# Patient Record
Sex: Female | Born: 1985
Health system: Southern US, Community
[De-identification: ages and names within clinical notes are randomized; demographics above are authoritative.]

## PROBLEM LIST (undated history)

## (undated) DIAGNOSIS — Z3490 Encounter for supervision of normal pregnancy, unspecified, unspecified trimester: Secondary | ICD-10-CM

## (undated) DIAGNOSIS — E559 Vitamin D deficiency, unspecified: Secondary | ICD-10-CM

## (undated) HISTORY — PX: TONSILLECTOMY: SUR1361

---

## 2012-07-16 ENCOUNTER — Encounter (HOSPITAL_BASED_OUTPATIENT_CLINIC_OR_DEPARTMENT_OTHER): Payer: Self-pay | Admitting: Emergency Medicine

## 2012-07-16 ENCOUNTER — Emergency Department (HOSPITAL_BASED_OUTPATIENT_CLINIC_OR_DEPARTMENT_OTHER)
Admission: EM | Admit: 2012-07-16 | Discharge: 2012-07-17 | Disposition: A | Discharge status: Home / Self Care | Payer: BC Managed Care – PPO | Attending: Emergency Medicine | Admitting: Emergency Medicine

## 2012-07-16 DIAGNOSIS — O200 Threatened abortion: Secondary | ICD-10-CM | POA: Insufficient documentation

## 2012-07-16 DIAGNOSIS — O99330 Smoking (tobacco) complicating pregnancy, unspecified trimester: Secondary | ICD-10-CM | POA: Insufficient documentation

## 2012-07-16 DIAGNOSIS — Z809 Family history of malignant neoplasm, unspecified: Secondary | ICD-10-CM | POA: Insufficient documentation

## 2012-07-16 DIAGNOSIS — Z833 Family history of diabetes mellitus: Secondary | ICD-10-CM | POA: Insufficient documentation

## 2012-07-16 NOTE — ED Notes (Signed)
approx 10 nweeks pregnant   Vag bleeding onet around 8p

## 2012-07-17 LAB — WET PREP, GENITAL
Trich, Wet Prep: NONE SEEN
Yeast Wet Prep HPF POC: NONE SEEN

## 2012-07-17 LAB — HCG, QUANTITATIVE, PREGNANCY: hCG, Beta Chain, Quant, S: 84081 m[IU]/mL — ABNORMAL HIGH (ref <5)

## 2012-07-17 NOTE — ED Notes (Signed)
Pt. Bedside US done by Dr. Read Drivers and Dr. Fonnie Jarvis

## 2012-07-17 NOTE — ED Provider Notes (Addendum)
History     CSN: [id number]  Arrival date & time 07/16/12  2317   First MD Initiated Contact with Patient 07/17/12 0050      Chief Complaint  Patient presents with  . Vaginal Bleeding    (Consider location/radiation/quality/duration/timing/severity/associated sxs/prior treatment) HPI This is a 26 year old female with twin pregnancy at about [redacted] weeks gestation. She developed vaginal bleeding yesterday evening about 8 PM. The bleeding is lighter than a period. She has not passed any tissue. She is having some mild suprapubic cramping. There are no specific mitigating or exacerbating factors. She does have prenatal care.  History reviewed. No pertinent past medical history.  Past Surgical History  Procedure Date  . Tonsillectomy     Family History  Problem Relation Age of Onset  . Cancer Other   . Diabetes Other     History  Substance Use Topics  . Smoking status: Current Every Day Smoker  . Smokeless tobacco: Not on file  . Alcohol Use: No    OB History    Grav Para Term Preterm Abortions TAB SAB Ect Mult Living   2 1              Review of Systems  All other systems reviewed and are negative.    Allergies  Review of patient's allergies indicates no known allergies.  Home Medications   Current Outpatient Rx  Name Route Sig Dispense Refill  . PRENATAL 27-0.8 MG PO TABS Oral Take 1 tablet by mouth daily.      BP 130/71  Pulse 74  Temp 98.2 F (36.8 C) (Oral)  Resp 20  Ht 5\' 8"  (1.727 m)  Wt 178 lb (80.74 kg)  BMI 27.06 kg/m2  SpO2 100%  Physical Exam General: Well-developed, well-nourished female in no acute distress; appearance consistent with age of record HENT: normocephalic, atraumatic Eyes: pupils equal round and reactive to light; extraocular muscles intact Neck: supple Heart: regular rate and rhythm Lungs: clear to auscultation bilaterally Abdomen: soft; nondistended; mild suprapubic tenderness; no masses or hepatosplenomegaly; bowel  sounds present Extremities: No deformity; full range of motion; pulses normal; no edema GU: Normal external genitalia; physiologic appearing vaginal discharge; cervical spotting; mild cervical motion tenderness; mild left adnexal tenderness Neurologic: Awake, alert and oriented; motor function intact in all extremities and symmetric; no facial droop Skin: Warm and dry Psychiatric: Normal mood and affect  Twin viable intrauterine pregnancy seen on bedside ultrasound  ED Course  Procedures (including critical care time)     MDM   Nursing notes and vitals signs, including pulse oximetry, reviewed.  Summary of this visit's results, reviewed by myself:  Labs:  Results for orders placed during the hospital encounter of 07/16/12  WET PREP, GENITAL      Component Value Range   Yeast Wet Prep HPF POC NONE SEEN  NONE SEEN   Trich, Wet Prep NONE SEEN  NONE SEEN   Clue Cells Wet Prep HPF POC MODERATE (*) NONE SEEN   WBC, Wet Prep HPF POC FEW (*) NONE SEEN    1:29 AM Patient was advised of ultrasound and lab findings. She has an appointment with her OB/GYN in 3 days.        Hanley Seamen, MD 07/17/12 0128  Hanley Seamen, MD 07/17/12 4098

## 2012-07-17 NOTE — ED Notes (Signed)
Pt. In no distress.

## 2012-10-19 ENCOUNTER — Encounter (HOSPITAL_BASED_OUTPATIENT_CLINIC_OR_DEPARTMENT_OTHER): Payer: Self-pay | Admitting: *Deleted

## 2012-10-19 ENCOUNTER — Emergency Department (HOSPITAL_BASED_OUTPATIENT_CLINIC_OR_DEPARTMENT_OTHER): Payer: BC Managed Care – PPO

## 2012-10-19 ENCOUNTER — Emergency Department (HOSPITAL_BASED_OUTPATIENT_CLINIC_OR_DEPARTMENT_OTHER)
Admission: EM | Admit: 2012-10-19 | Discharge: 2012-10-19 | Disposition: A | Discharge status: Home / Self Care | Payer: BC Managed Care – PPO | Attending: Emergency Medicine | Admitting: Emergency Medicine

## 2012-10-19 DIAGNOSIS — O9989 Other specified diseases and conditions complicating pregnancy, childbirth and the puerperium: Secondary | ICD-10-CM | POA: Insufficient documentation

## 2012-10-19 DIAGNOSIS — O26899 Other specified pregnancy related conditions, unspecified trimester: Secondary | ICD-10-CM

## 2012-10-19 DIAGNOSIS — O99334 Smoking (tobacco) complicating childbirth: Secondary | ICD-10-CM | POA: Insufficient documentation

## 2012-10-19 DIAGNOSIS — R109 Unspecified abdominal pain: Secondary | ICD-10-CM

## 2012-10-19 HISTORY — DX: Encounter for supervision of normal pregnancy, unspecified, unspecified trimester: Z34.90

## 2012-10-19 LAB — COMPREHENSIVE METABOLIC PANEL
ALT: 7 U/L (ref 0–35)
Alkaline Phosphatase: 58 U/L (ref 39–117)
GFR calc Af Amer: >90 mL/min (ref >90)
Glucose, Bld: 81 mg/dL (ref 70–99)
Potassium: 3.6 mEq/L (ref 3.5–5.1)
Sodium: 135 mEq/L (ref 135–145)
Total Protein: 6.7 g/dL (ref 6.0–8.3)

## 2012-10-19 LAB — URINALYSIS, ROUTINE W REFLEX MICROSCOPIC
Bilirubin Urine: NEGATIVE
Glucose, UA: NEGATIVE mg/dL
Hgb urine dipstick: NEGATIVE
Specific Gravity, Urine: 1.02 (ref 1.005–1.030)
pH: 8 (ref 5.0–8.0)

## 2012-10-19 LAB — CBC WITH DIFFERENTIAL/PLATELET
Eosinophils Absolute: 0.1 10*3/uL (ref 0.0–0.7)
Lymphocytes Relative: 23 % (ref 12–46)
Lymphs Abs: 1.9 10*3/uL (ref 0.7–4.0)
MCH: 27.6 pg (ref 26.0–34.0)
Neutro Abs: 5.4 10*3/uL (ref 1.7–7.7)
Neutrophils Relative %: 66 % (ref 43–77)
Platelets: 191 10*3/uL (ref 150–400)
RBC: 3.66 MIL/uL — ABNORMAL LOW (ref 3.87–5.11)
WBC: 8.2 10*3/uL (ref 4.0–10.5)

## 2012-10-19 LAB — WET PREP, GENITAL: Trich, Wet Prep: NONE SEEN

## 2012-10-19 MED ORDER — SODIUM CHLORIDE 0.9 % IV BOLUS (SEPSIS): 500.0000 mL | Freq: Once | INTRAVENOUS | Status: DC

## 2012-10-19 NOTE — ED Notes (Signed)
Abdominal pain x 3 hours. [redacted] weeks pregnant. Denies vaginal discharge. Prenatal care in [place] by Dr Shawnie Pons

## 2012-10-19 NOTE — ED Notes (Signed)
Pt is [redacted] weeks pregnant with twins.

## 2012-10-19 NOTE — ED Provider Notes (Signed)
History     CSN: [id number]  Arrival date & time 10/19/12  1749   First MD Initiated Contact with Patient 10/19/12 1801      Chief Complaint  Patient presents with  . Abdominal Pain    (Consider location/radiation/quality/duration/timing/severity/associated sxs/prior treatment) HPI Comments: Pt states that she is 24 and 6 pregnant with her second pregnancy and she is pregnant with twins:pt denies bleeding:pt denies history of any problems with previous pregnancy:pt c/o left upper and right lower abdominal pain  Patient is a 26 y.o. female presenting with abdominal pain. The history is provided by the patient. No language interpreter was used.  Abdominal Pain The primary symptoms of the illness include abdominal pain. The primary symptoms of the illness do not include fever, nausea, vomiting, diarrhea, dysuria, vaginal discharge or vaginal bleeding. The current episode started 3 to 5 hours ago. The onset of the illness was sudden. The problem has not changed since onset. The patient states that she believes she is currently pregnant. Symptoms associated with the illness do not include urgency, hematuria, frequency or back pain.    Past Medical History  Diagnosis Date  . Pregnant     Past Surgical History  Procedure Date  . Tonsillectomy     Family History  Problem Relation Age of Onset  . Cancer Other   . Diabetes Other     History  Substance Use Topics  . Smoking status: Current Every Day Smoker -- 0.5 packs/day    Types: Cigarettes  . Smokeless tobacco: Not on file  . Alcohol Use: No    OB History    Grav Para Term Preterm Abortions TAB SAB Ect Mult Living   2 1              Review of Systems  Constitutional: Negative for fever.  Respiratory: Negative.   Cardiovascular: Negative.   Gastrointestinal: Positive for abdominal pain. Negative for nausea, vomiting and diarrhea.  Genitourinary: Negative for dysuria, urgency, frequency, hematuria, vaginal bleeding  and vaginal discharge.  Musculoskeletal: Negative for back pain.    Allergies  Review of patient's allergies indicates no known allergies.  Home Medications   Current Outpatient Rx  Name  Route  Sig  Dispense  Refill  . PRENATAL 27-0.8 MG PO TABS   Oral   Take 1 tablet by mouth daily.           BP 110/61  Pulse 76  Temp 98.5 F (36.9 C) (Oral)  Resp 20  SpO2 99%  Physical Exam  Nursing note and vitals reviewed. Constitutional: She is oriented to person, place, and time. She appears well-developed and well-nourished.  HENT:  Head: Normocephalic and atraumatic.  Eyes: Conjunctivae normal and EOM are normal.  Neck: Neck supple.  Cardiovascular: Normal rate and regular rhythm.   Pulmonary/Chest: Effort normal and breath sounds normal.  Abdominal:       Gravid:generalized tenderness  Genitourinary:       White vaginal discharge:non dilated  Musculoskeletal: Normal range of motion.  Neurological: She is alert and oriented to person, place, and time.  Skin: Skin is warm and dry.  Psychiatric: She has a normal mood and affect.    ED Course  Procedures (including critical care time)  Labs Reviewed  URINALYSIS, ROUTINE W REFLEX MICROSCOPIC - Abnormal; Notable for the following:    APPearance CLOUDY (*)     Ketones, ur 15 (*)     All other components within normal limits  WET PREP, GENITAL - Abnormal;  Notable for the following:    Clue Cells Wet Prep HPF POC MANY (*)     WBC, Wet Prep HPF POC MODERATE (*)     All other components within normal limits  CBC WITH DIFFERENTIAL - Abnormal; Notable for the following:    RBC 3.66 (*)     Hemoglobin 10.1 (*)     HCT 30.3 (*)     All other components within normal limits  COMPREHENSIVE METABOLIC PANEL - Abnormal; Notable for the following:    Albumin 2.9 (*)     All other components within normal limits   No results found.   1. Abdominal pain in pregnancy       MDM  Pt symptoms have resolved:no sign of  contracting noted:discussed with Dr. Conard Novak of [place] [facility] who stated that she couldn't make a recommendation but that pt could call the office to follow WU:[id number] shows nothing acute:pt doesn't have a uti and pt has not had any fluid leaking or bleeding       Teressa Lower, NP 10/19/12 2206

## 2012-10-19 NOTE — ED Notes (Signed)
Olegario Messier at [facility] called to check on pt

## 2012-10-19 NOTE — ED Notes (Signed)
Jamie Knee RN from [facility] called to notify me that they only have one set of monitors. They are currently looking for another one for baby B.  I advised RN to get fetal doppler heart tone on baby B while looking for another monitor and call me with results.

## 2012-10-19 NOTE — ED Notes (Addendum)
Westley Hummer RN at [facility] called with results of fht for baby B Stated they are 152 Patient to go for ultrasound. Avised to get fht on baby B with doppler Q 15 min.

## 2012-10-19 NOTE — ED Notes (Signed)
Notified by [facility] ED charge nurse that they have a patient being triaged pregnant with twins presenting with c/o abdominal pain. Patient states she is a patient of Dr. Desmond Dike in [place] and is 24+[redacted] weeks along

## 2012-10-19 NOTE — ED Notes (Signed)
Patient transported to Ultrasound 

## 2012-10-20 NOTE — ED Provider Notes (Signed)
Medical screening examination/treatment/procedure(s) were performed by non-physician practitioner and as supervising physician I was immediately available for consultation/collaboration.  Jones Skene, M.D.      Jones Skene, MD 10/20/12 1610

## 2014-07-11 IMAGING — US US OB LIMITED
1 series · 14 of 28 positions shown · non-contrast
Comparison: none

CLINICAL DATA: Abdominal pain, twin pregnancy

[Series 1: us ob limited · 0.30mm/px · 14 of 45 slices shown]
[im 2/45]
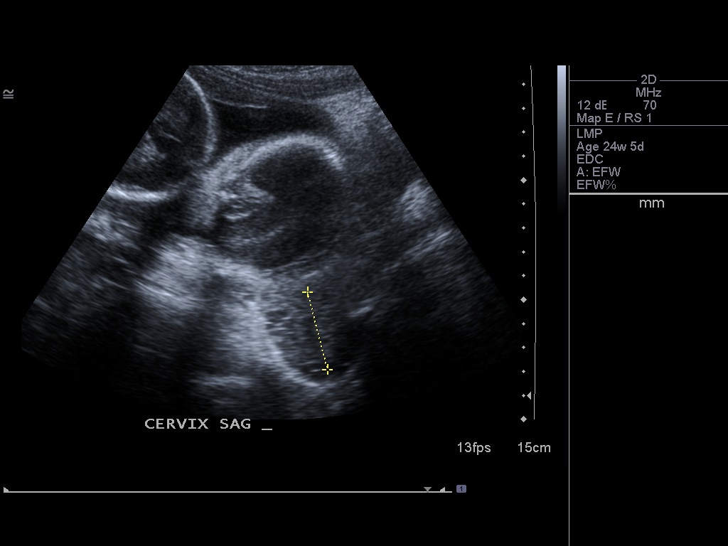
[im 5/45]
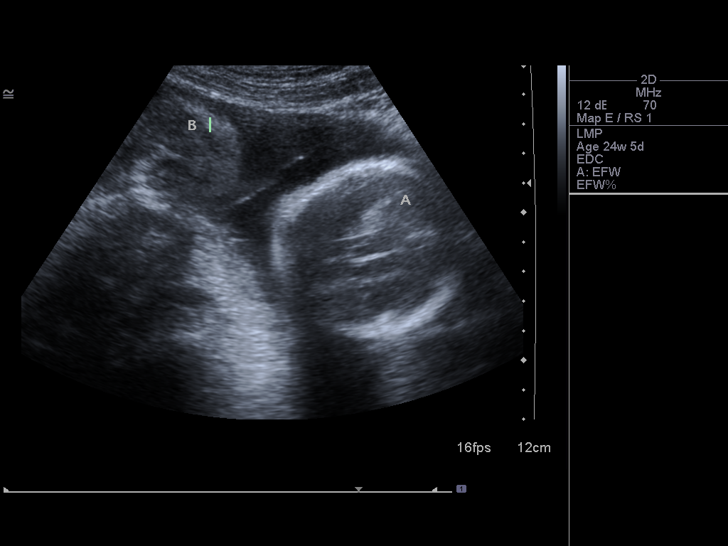
[im 9/45]
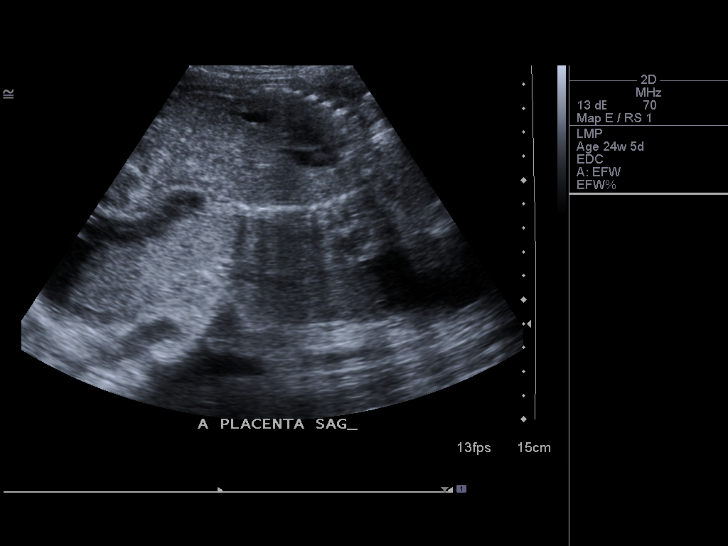
[im 12/45]
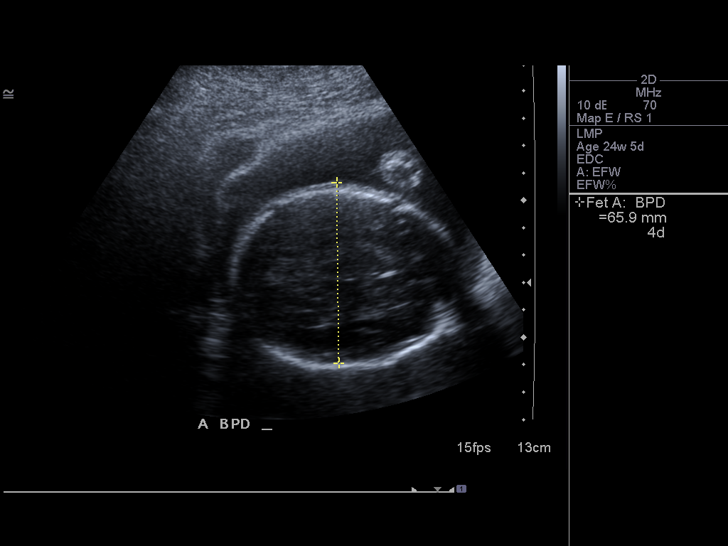
[im 15/45]
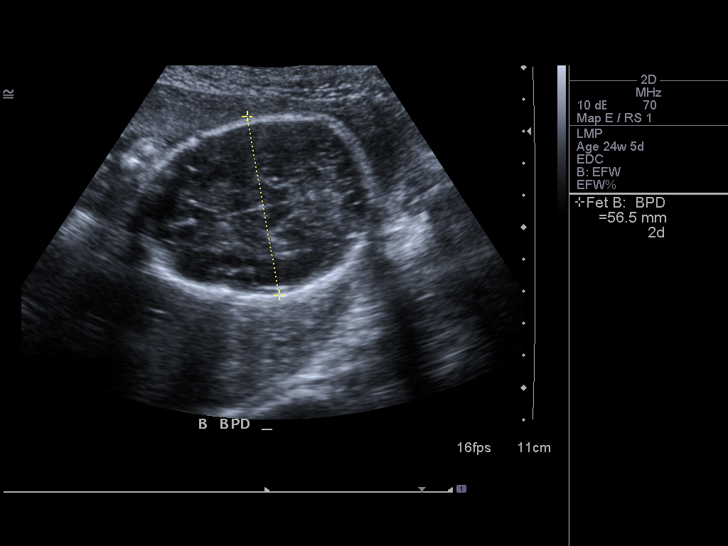
[im 18/45]
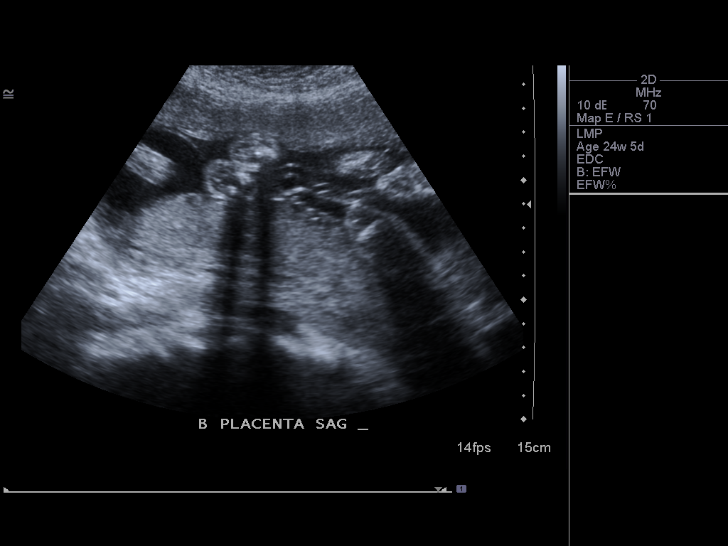
[im 22/45]
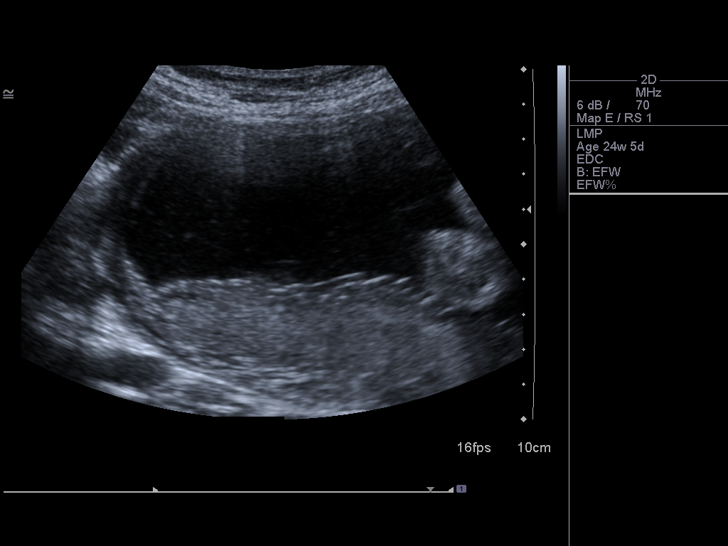
[im 25/45]
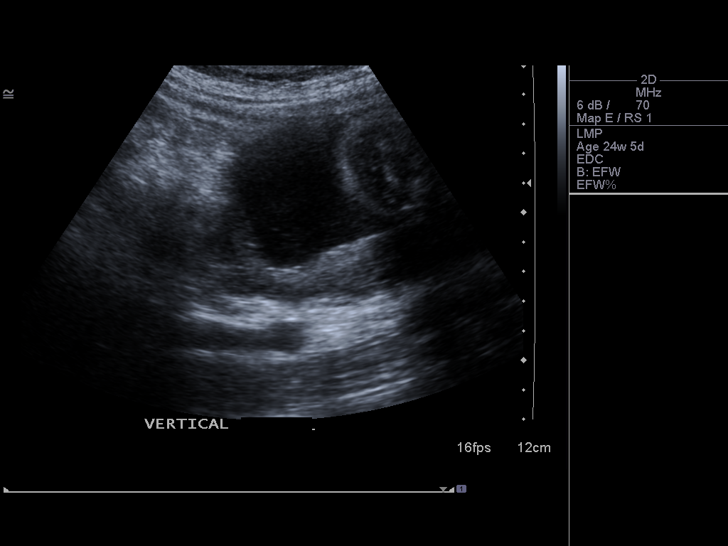
[im 28/45]
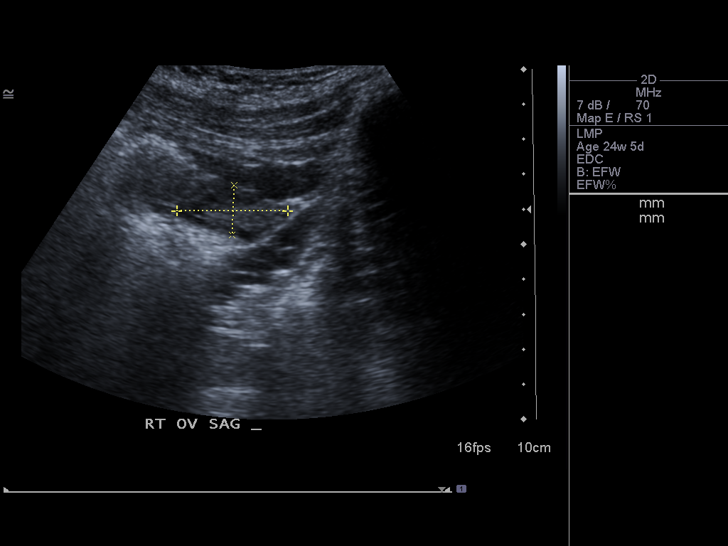
[im 31/45]
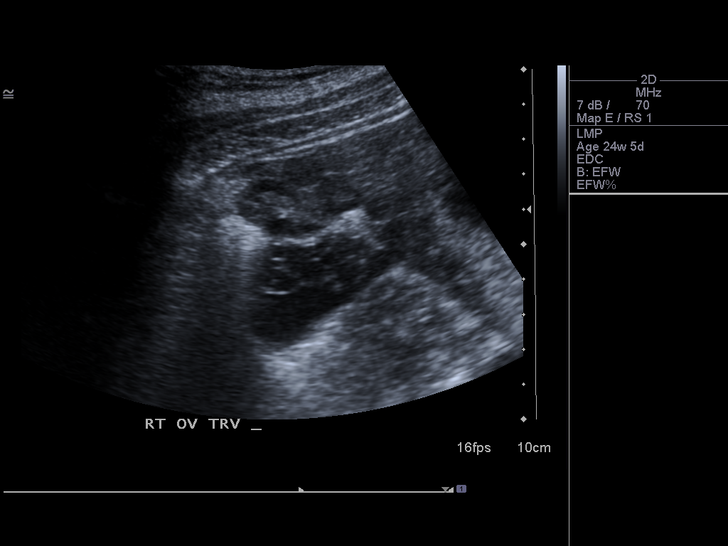
[im 35/45]
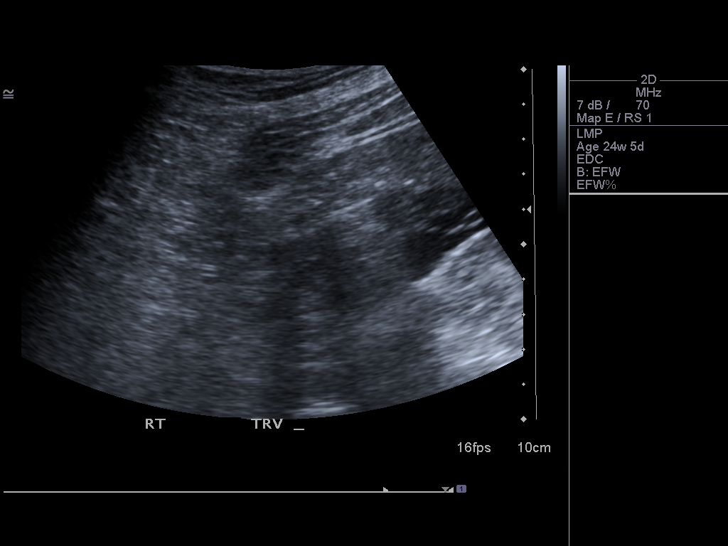
[im 38/45]
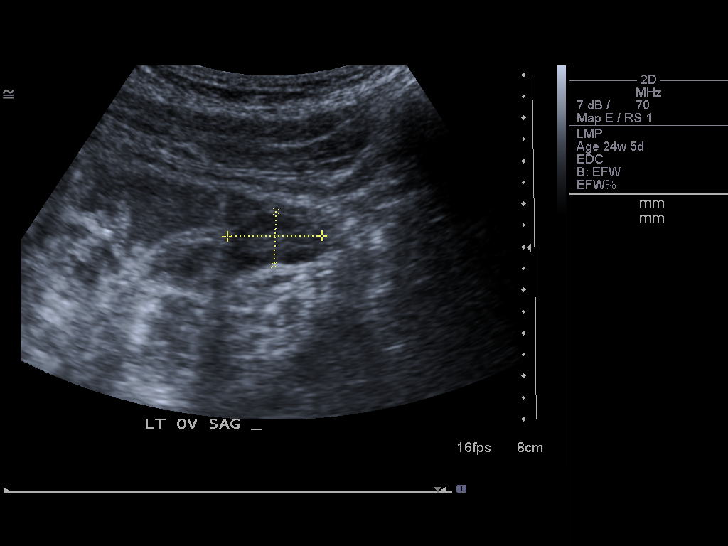
[im 41/45]
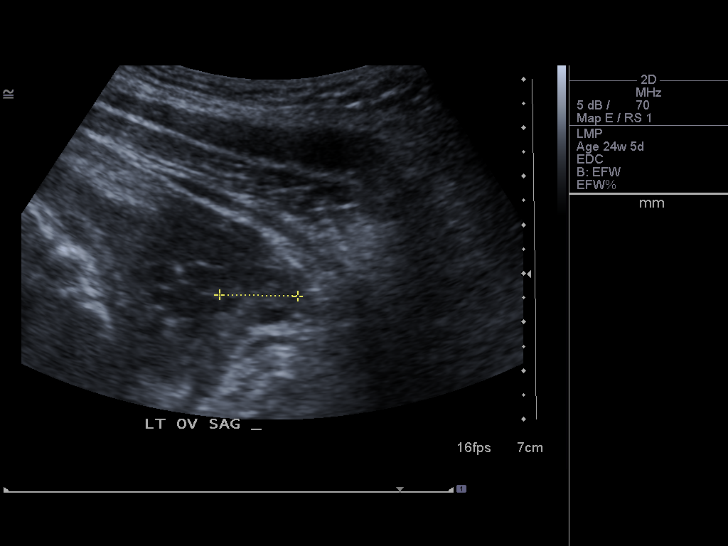
[im 45/45]
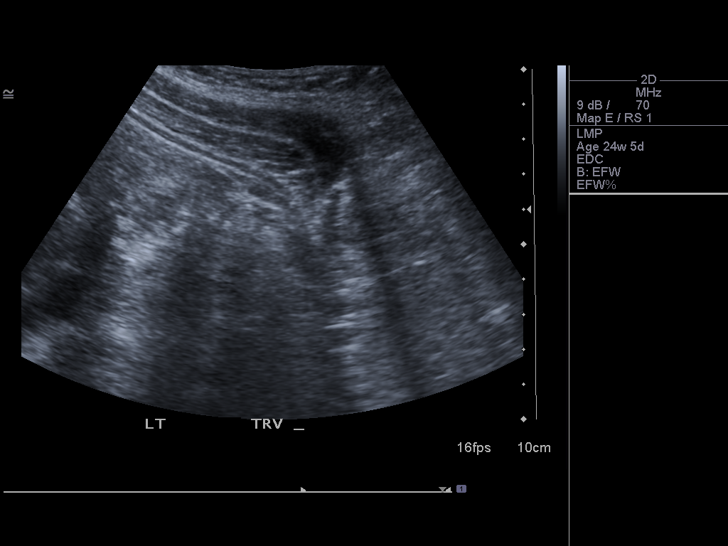

[14 of 28 positions shown; findings below may reference images not displayed]

LIMITED TWIN OBSTETRIC 14+ WK ULTRASOUND

TWIN A
Heart Rate: 140 bpm
Movement:  Visualized
Presentation: Cephalic
Placental Location: Posterior
Previa: No
Amniotic Fluid (Subjective): Normal
Amniotic Fluid (Objective): Vertical Pocket: 5.0cm

FETAL BIOMETRY (TWIN A)
BPD:  6.6cm 26w         4d

TWIN B
Heart Rate:  787bpm
Movement:  Visualized
Presentation:  Transverse, head maternal left
Placental Location: Fundal
Previa: No
Amniotic Fluid (Subjective): Normal
Amniotic Fluid (Objective): Vertical Pocket: 5.0cm

FETAL BIOMETRY (TWIN B)
BPD:  [5.7]cm     [23]w       [5]d

Assigned GA:            [24]w [5]d  Assigned EDC: [02/02/13]

MATERNAL FINDINGS:
Cervix:  Long and closed by transabdominal technique
IMPRESSION: Living intrauterine twin gestation without acute abnormality on
this limited exam.  Apparent discrepancy in fetal biometry is
noted, however examination today is limited to biparietal diameter
measurement and comprehensive fetal survey and biometry evaluation
is recommended if not already recently performed.

## 2014-09-01 ENCOUNTER — Encounter (HOSPITAL_BASED_OUTPATIENT_CLINIC_OR_DEPARTMENT_OTHER): Payer: Self-pay | Admitting: *Deleted

## 2017-09-07 ENCOUNTER — Emergency Department (HOSPITAL_BASED_OUTPATIENT_CLINIC_OR_DEPARTMENT_OTHER)
Admission: EM | Admit: 2017-09-07 | Discharge: 2017-09-07 | Disposition: A | Discharge status: Home / Self Care | Payer: Worker's Compensation | Attending: Emergency Medicine | Admitting: Emergency Medicine

## 2017-09-07 ENCOUNTER — Encounter (HOSPITAL_BASED_OUTPATIENT_CLINIC_OR_DEPARTMENT_OTHER): Payer: Self-pay

## 2017-09-07 DIAGNOSIS — F17210 Nicotine dependence, cigarettes, uncomplicated: Secondary | ICD-10-CM | POA: Insufficient documentation

## 2017-09-07 DIAGNOSIS — S39012D Strain of muscle, fascia and tendon of lower back, subsequent encounter: Secondary | ICD-10-CM | POA: Insufficient documentation

## 2017-09-07 DIAGNOSIS — X500XXD Overexertion from strenuous movement or load, subsequent encounter: Secondary | ICD-10-CM | POA: Insufficient documentation

## 2017-09-07 DIAGNOSIS — M545 Low back pain: Secondary | ICD-10-CM

## 2017-09-07 HISTORY — DX: Vitamin D deficiency, unspecified: E55.9

## 2017-09-07 LAB — PREGNANCY, URINE: Preg Test, Ur: NEGATIVE

## 2017-09-07 MED ORDER — CYCLOBENZAPRINE HCL 10 MG PO TABS: 10.0000 mg | ORAL_TABLET | Freq: Two times a day (BID) | ORAL | 0 refills | Status: DC | PRN

## 2017-09-07 MED FILL — CYCLOBENZAPRINE 10 MG TAB: 10 | 10 days supply | Qty: 20 | Fill #0

## 2017-09-07 NOTE — Discharge Instructions (Addendum)
You can take Tylenol or Ibuprofen as directed for pain. You can alternate Tylenol and Ibuprofen every 4 hours. If you take Tylenol at 1pm, then you can take Ibuprofen at 5pm. Then you can take Tylenol again at 9pm.   Take Flexeril as prescribed. This medication will make you drowsy so do not drive or drink alcohol when taking it.  Follow-up with your primary care doctor in the next 2-4 days.  If you do not have a primary care doctor, you can use the clinic provided paperwork.  Return to the Emergency Department immediately for any worsening back pain, neck pain, difficulty walking, numbness/weaknss of your arms or legs, urinary or bowel accidents, fever or any other worsening or concerning symptoms.

## 2017-09-07 NOTE — ED Provider Notes (Signed)
[facility] EMERGENCY DEPARTMENT Provider Note   CSN: [id number] Arrival date & time: 09/07/17  1207     History   Chief Complaint Chief Complaint  Patient presents with  . Back Pain    HPI Jamie Hamilton is a 31 y.o. female who presents with lower back pain that started approximately 4 days ago.  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  She denies any other preceding trauma, injury, fall.  Patient reports that she has been taking Tylenol once in the morning once at night for symptom relief.  Patient reports that she is also applied heat with minimal improvement.  She reports that she still is able to ambulate and bear weight but reports worsening pain with ambulation.  Patient was [facility] 11/[facility] for the same symptoms.  X-rays at that time were normal.  Patient was instructed to take NSAIDs and use heat therapy. Denies fevers, weight loss, numbness/weakness of upper and lower extremities, bowel/bladder incontinence, saddle anesthesia, history of back surgery, history of IVDA.   The history is provided by the patient.    Past Medical History:  Diagnosis Date  . Pregnant   . Vitamin D deficiency     There are no active problems to display for this patient.   Past Surgical History:  Procedure Laterality Date  . TONSILLECTOMY      OB History    Gravida Para Term Preterm AB Living   2 1           SAB TAB Ectopic Multiple Live Births                   Home Medications    Prior to Admission medications   Medication Sig Start Date End Date Taking? Authorizing Provider  Cholecalciferol (VITAMIN D PO) Take by mouth.   Yes [provider]  cyclobenzaprine (FLEXERIL) 10 MG tablet Take 1 tablet (10 mg total) 2 (two) times daily as needed by mouth for muscle spasms. 09/07/17   Maxwell CaulLayden, Lindsey A, PA-C    Family History Family History  Problem Relation Age of Onset  . Cancer Other   . Diabetes Other      Social History Social History   Tobacco Use  . Smoking status: Current Every Day Smoker    Packs/day: 0.50    Types: Cigarettes  . Smokeless tobacco: Never Used  Substance Use Topics  . Alcohol use: Yes    Comment: occ  . Drug use: No     Allergies   Patient has no known allergies.   Review of Systems Review of Systems  Musculoskeletal: Positive for back pain. Negative for gait problem and neck pain.  Neurological: Negative for weakness and numbness.     Physical Exam Updated Vital Signs BP (!) 112/59   Pulse 67   Temp 98.6 F (37 C) (Oral)   Resp 20   Ht 5\' 8"  (1.727 m)   Wt 101.2 kg (223 lb)   SpO2 100%   Breastfeeding? Unknown   BMI 33.91 kg/m   Physical Exam  Constitutional: She appears well-developed and well-nourished.  HENT:  Head: Normocephalic and atraumatic.  Eyes: Conjunctivae and EOM are normal. Right eye exhibits no discharge. Left eye exhibits no discharge. No scleral icterus.  Neck:  Full flexion/extension and lateral movement of neck fully intact. No bony midline tenderness. No deformities or crepitus.   Pulmonary/Chest: Effort normal.  Musculoskeletal:  Arms: Diffuse muscular tenderness overlying the entire lumbar region. No deformity or crepitus noted.  Limited flexion/extension of back secondary to patient's pain.  Neurological: She is alert.  Follows commands, Moves all extremities  5/5 strength to BUE and BLE  Sensation intact throughout all major nerve distributions Normal gait  Skin: Skin is warm and dry.  Psychiatric: She has a normal mood and affect. Her speech is normal and behavior is normal.  Nursing note and vitals reviewed.    ED Treatments / Results  Labs (all labs ordered are listed, but only abnormal results are displayed) Labs Reviewed  PREGNANCY, URINE    EKG  EKG Interpretation None       Radiology No results found.  Procedures Procedures (including critical care time)  Medications Ordered  in ED Medications - No data to display   Initial Impression / Assessment and Plan / ED Course  I have reviewed the triage vital signs and the nursing notes.  Pertinent labs & imaging results that were available during my care of the patient were reviewed by me and considered in my medical decision making (see chart for details).     31 year old female who presents with lower back pain times 4 days.  History of picking up heavy tables while at work.  Seen on [place] ED on 09/04/17 for the same symptoms.  Conservative therapies discussed.  Patient comes to the emergency department today because of persistent pain.  No difficulties ambulating. Patient is afebrile, non-toxic appearing, sitting comfortably on examination table. Vital signs reviewed and stable.  No red flag symptoms.  No neurological deficits on exam.  Consider musculoskeletal strain.  History/physical exam not concerning for spinal abscess or cauda equina.  Records reviewed to the patient was seen at the emergency depart time and had XRs that showed no abnormalities.  Given that patient denies any new trauma, injury, fall.  No indication for further imaging at this time.  Patient's note states that provider sent patient home with NSAIDs and muscle relaxers.  No evidence of muscle or extra prescription noted in chart.  Patient reports that she was never prescribed any other medication.  Patient evaluated on prescription monitoring program.  No evidence of Flexeril.  Plan to treat with NSAIDs and Flexeril. Provided patient with a list of clinic resources to use if he does not have a PCP. Instructed to call them today to arrange follow-up in the next 24-48 hours. Strict return precautions discussed. Patient expresses understanding and agreement to plan.     Final Clinical Impressions(s) / ED Diagnoses   Final diagnoses:  Strain of lumbar region, subsequent encounter  Acute bilateral low back pain, with sciatica presence unspecified     ED Discharge Orders        Ordered    cyclobenzaprine (FLEXERIL) 10 MG tablet  2 times daily PRN     09/07/17 1448       Maxwell CaulLayden, Lindsey A, PA-C 09/07/17 1513    Alvira MondaySchlossman, Erin, MD 09/08/17 1639

## 2017-09-07 NOTE — ED Notes (Signed)
Family at bedside. 

## 2017-09-07 NOTE — ED Triage Notes (Addendum)
c/o lower back pain-started 4 days ago after lifting tables at work-states is a WC injury-slow gait-NAD-when asked pt states she was seen at [facility] 11/5 for injury

## 2017-09-07 NOTE — ED Notes (Signed)
Pt in hallway asking about leaving; PA-C states she is getting her discharge paperwork together.

## 2017-09-07 NOTE — ED Notes (Signed)
Pt states she needs to leave to pick up her kids. PA-C examining another pt at this time; told pt I will ask PA-C about her disposition as soon as she steps out.

## 2019-07-13 ENCOUNTER — Emergency Department (HOSPITAL_BASED_OUTPATIENT_CLINIC_OR_DEPARTMENT_OTHER)
Admission: EM | Admit: 2019-07-13 | Discharge: 2019-07-13 | Disposition: A | Discharge status: Home / Self Care | Payer: Medicaid Other | Attending: Emergency Medicine | Admitting: Emergency Medicine

## 2019-07-13 ENCOUNTER — Emergency Department (HOSPITAL_BASED_OUTPATIENT_CLINIC_OR_DEPARTMENT_OTHER): Payer: Medicaid Other

## 2019-07-13 ENCOUNTER — Encounter (HOSPITAL_BASED_OUTPATIENT_CLINIC_OR_DEPARTMENT_OTHER): Payer: Self-pay

## 2019-07-13 ENCOUNTER — Other Ambulatory Visit: Payer: Self-pay

## 2019-07-13 DIAGNOSIS — J45909 Unspecified asthma, uncomplicated: Secondary | ICD-10-CM | POA: Insufficient documentation

## 2019-07-13 DIAGNOSIS — Z20828 Contact with and (suspected) exposure to other viral communicable diseases: Secondary | ICD-10-CM | POA: Diagnosis not present

## 2019-07-13 DIAGNOSIS — F17210 Nicotine dependence, cigarettes, uncomplicated: Secondary | ICD-10-CM | POA: Diagnosis not present

## 2019-07-13 DIAGNOSIS — J069 Acute upper respiratory infection, unspecified: Secondary | ICD-10-CM | POA: Diagnosis not present

## 2019-07-13 DIAGNOSIS — R0781 Pleurodynia: Secondary | ICD-10-CM

## 2019-07-13 DIAGNOSIS — R05 Cough: Secondary | ICD-10-CM | POA: Diagnosis present

## 2019-07-13 LAB — BASIC METABOLIC PANEL
Anion gap: 8 (ref 5–15)
BUN: 7 mg/dL (ref 6–20)
CO2: 25 mmol/L (ref 22–32)
Calcium: 9 mg/dL (ref 8.9–10.3)
Chloride: 104 mmol/L (ref 98–111)
Creatinine, Ser: 0.62 mg/dL (ref 0.44–1.00)
GFR calc Af Amer: >60 mL/min (ref >60)
GFR calc non Af Amer: >60 mL/min (ref >60)
Glucose, Bld: 89 mg/dL (ref 70–99)
Potassium: 3.2 mmol/L — ABNORMAL LOW (ref 3.5–5.1)
Sodium: 137 mmol/L (ref 135–145)

## 2019-07-13 LAB — CBC WITH DIFFERENTIAL/PLATELET
Abs Immature Granulocytes: 0.03 10*3/uL (ref 0.00–0.07)
Basophils Absolute: 0 10*3/uL (ref 0.0–0.1)
Basophils Relative: 1 %
Eosinophils Absolute: 1.4 10*3/uL — ABNORMAL HIGH (ref 0.0–0.5)
Eosinophils Relative: 18 %
HCT: 41.4 % (ref 36.0–46.0)
Hemoglobin: 13.1 g/dL (ref 12.0–15.0)
Immature Granulocytes: 0 %
Lymphocytes Relative: 15 %
Lymphs Abs: 1.1 10*3/uL (ref 0.7–4.0)
MCH: 26.6 pg (ref 26.0–34.0)
MCHC: 31.6 g/dL (ref 30.0–36.0)
MCV: 84 fL (ref 80.0–100.0)
Monocytes Absolute: 0.7 10*3/uL (ref 0.1–1.0)
Monocytes Relative: 9 %
Neutro Abs: 4.5 10*3/uL (ref 1.7–7.7)
Neutrophils Relative %: 57 %
Platelets: 286 10*3/uL (ref 150–400)
RBC: 4.93 MIL/uL (ref 3.87–5.11)
RDW: 13.7 % (ref 11.5–15.5)
WBC: 7.8 10*3/uL (ref 4.0–10.5)
nRBC: 0 % (ref 0.0–0.2)

## 2019-07-13 LAB — D-DIMER, QUANTITATIVE (NOT AT ARMC): D-Dimer, Quant: <0.27 ug/mL-FEU (ref 0.00–0.50)

## 2019-07-13 LAB — PREGNANCY, URINE: Preg Test, Ur: NEGATIVE

## 2019-07-13 LAB — TROPONIN I (HIGH SENSITIVITY): Troponin I (High Sensitivity): 2 ng/L (ref <18)

## 2019-07-13 MED ORDER — ALBUTEROL SULFATE HFA 108 (90 BASE) MCG/ACT IN AERS
4.0000 | INHALATION_SPRAY | Freq: Once | RESPIRATORY_TRACT | Status: AC
  Administered 2019-07-13: 16:00:00 4 via RESPIRATORY_TRACT
  Filled 2019-07-13: qty 6.7

## 2019-07-13 MED ORDER — PREDNISONE 20 MG PO TABS: 40.0000 mg | ORAL_TABLET | Freq: Every day | ORAL | 0 refills | Status: AC

## 2019-07-13 NOTE — ED Provider Notes (Signed)
Emergency Department Provider Note   I have reviewed the triage vital signs and the nursing notes.   HISTORY  Chief Complaint Cough   HPI Jamie Hamilton is a 33 y.o. female with past medical history of asthma presents to the emergency department with cough, sinus pressure, sore throat, and congestion.  She has had these symptoms for the past several days but 2 days ago developed a central chest pressure with some pleuritic type pain worse on the left.  She denies fevers or shaking chills.  No known sick contacts.  Denies abdominal pain, vomiting, diarrhea.  Denies headache.  No radiation of symptoms or other modifying factors.  Past Medical History:  Diagnosis Date  . Pregnant   . Vitamin D deficiency     There are no active problems to display for this patient.   Past Surgical History:  Procedure Laterality Date  . TONSILLECTOMY      Allergies Patient has no known allergies.  Family History  Problem Relation Age of Onset  . Cancer Other   . Diabetes Other     Social History Social History   Tobacco Use  . Smoking status: Current Every Day Smoker    Packs/day: 0.50    Types: Cigarettes  . Smokeless tobacco: Never Used  Substance Use Topics  . Alcohol use: Yes    Comment: occ  . Drug use: Yes    Types: Marijuana    Review of Systems  Constitutional: No fever/chills Eyes: No visual changes. ENT: No sore throat. Positive rhinorrhea.  Cardiovascular: Positive chest pain. Respiratory: Positive shortness of breath and wheezing.  Gastrointestinal: No abdominal pain.  No nausea, no vomiting.  No diarrhea.  No constipation. Genitourinary: Negative for dysuria. Musculoskeletal: Negative for back pain. Skin: Negative for rash. Neurological: Negative for headaches, focal weakness or numbness.  10-point ROS otherwise negative.  ____________________________________________   PHYSICAL EXAM:  VITAL SIGNS: ED Triage Vitals  Enc Vitals Group     BP  07/13/19 1515 128/75     Pulse Rate 07/13/19 1516 74     Resp 07/13/19 1516 18     Temp 07/13/19 1515 98.9 F (37.2 C)     Temp Source 07/13/19 1515 Oral     SpO2 07/13/19 1516 100 %   Constitutional: Alert and oriented. Well appearing and in no acute distress. Eyes: Conjunctivae are normal.  Head: Atraumatic. Nose: No congestion/rhinnorhea. Mouth/Throat: Mucous membranes are moist.  Neck: No stridor.  Cardiovascular: Normal rate, regular rhythm. Good peripheral circulation. Grossly normal heart sounds.   Respiratory: Normal respiratory effort.  No retractions. Lungs with end-expiratory wheezing bilaterally. Good air movement.  Gastrointestinal: No distention.  Musculoskeletal: No lower extremity tenderness nor edema. No gross deformities of extremities. Neurologic:  Normal speech and language. Skin:  Skin is warm, dry and intact. No rash noted.   ____________________________________________   LABS (all labs ordered are listed, but only abnormal results are displayed)  Labs Reviewed  BASIC METABOLIC PANEL - Abnormal; Notable for the following components:      Result Value   Potassium 3.2 (*)    All other components within normal limits  CBC WITH DIFFERENTIAL/PLATELET - Abnormal; Notable for the following components:   Eosinophils Absolute 1.4 (*)    All other components within normal limits  NOVEL CORONAVIRUS, NAA (HOSP ORDER, SEND-OUT TO REF LAB; TAT 18-24 HRS)  D-DIMER, QUANTITATIVE (NOT AT [facility])  PREGNANCY, URINE  TROPONIN I (HIGH SENSITIVITY)   ____________________________________________  EKG   EKG Interpretation  Date/Time:  Saturday July 13 2019 15:10:09 EDT Ventricular Rate:  76 PR Interval:  182 QRS Duration: 90 QT Interval:  376 QTC Calculation: 423 R Axis:   70 Text Interpretation:  Normal sinus rhythm Normal ECG No STEMI  Confirmed by Nanda Quinton [phone]) on 07/13/2019 3:26:02 PM       ____________________________________________  RADIOLOGY   Dg Chest Portable 1 View  Result Date: 07/13/2019 CLINICAL DATA:  Cough with chest pressure and sore throat. EXAM: PORTABLE CHEST 1 VIEW COMPARISON:  12/06/2016 FINDINGS: 1548 hours. The lungs are clear without focal pneumonia, edema, pneumothorax or pleural effusion. The cardiopericardial silhouette is within normal limits for size. The visualized bony structures of the thorax are intact. IMPRESSION: Stable.  No acute cardiopulmonary findings. Electronically Signed   By: Misty Stanley M.D.   On: 07/13/2019 16:13    ____________________________________________   PROCEDURES  Procedure(s) performed:   Procedures  None  ____________________________________________   INITIAL IMPRESSION / ASSESSMENT AND PLAN / ED COURSE  Pertinent labs & imaging results that were available during my care of the patient were reviewed by me and considered in my medical decision making (see chart for details).   Patient presents to the emergency department for evaluation of bronchitis-like symptoms.  She has some wheezing on exam but is in no distress.  No hypoxemia.  Lower suspicion for COVID-19.  Patient's chest discomfort is somewhat pleuritic by description.  EKG with no acute ischemic findings.  Patient's wheezing improved after albuterol inhaler in the emergency department.  Chest x-ray and lab work reviewed.  D-dimer negative.  Troponin negative.  Plan for COVID-19 testing and instructions given for self isolation for 10 days. Can return after that time as long as symptoms have resolved.  With wheezing and possible mild asthma exacerbation patient was sent home with albuterol inhaler and given steroid burst.  No antibiotics at this time.  Instructions given on accessing MyChart to follow her COVID-19 results.   Jamie Hamilton was evaluated in Emergency Department on 07/13/2019 for the symptoms described in the history of present illness. She was evaluated in the context of the global COVID-19 pandemic,  which necessitated consideration that the patient might be at risk for infection with the SARS-CoV-2 virus that causes COVID-19. Institutional protocols and algorithms that pertain to the evaluation of patients at risk for COVID-19 are in a state of rapid change based on information released by regulatory bodies including the CDC and federal and state organizations. These policies and algorithms were followed during the patient's care in the ED. ____________________________________________  FINAL CLINICAL IMPRESSION(S) / ED DIAGNOSES  Final diagnoses:  Viral URI with cough  Pleuritic chest pain     MEDICATIONS GIVEN DURING THIS VISIT:  Medications  albuterol (VENTOLIN HFA) 108 (90 Base) MCG/ACT inhaler 4 puff (4 puffs Inhalation Given 07/13/19 1543)     NEW OUTPATIENT MEDICATIONS STARTED DURING THIS VISIT:  New Prescriptions   PREDNISONE (DELTASONE) 20 MG TABLET    Take 2 tablets (40 mg total) by mouth daily for 5 days.    Note:  This document was prepared using Dragon voice recognition software and may include unintentional dictation errors.  Nanda Quinton, MD Emergency Medicine    Long, Wonda Old[facility], MD 07/13/19 [phone]

## 2019-07-13 NOTE — Discharge Instructions (Signed)
You have been seen in the Emergency Department (ED) today for a likely viral illness.  Please drink plenty of clear fluids (water, Gatorade, chicken broth, etc).  You may use Tylenol and/or Motrin according to label instructions.  You can alternate between the two without any side effects.   Please follow up with your doctor as listed above.  I am testing you for COVID-19.  You can follow the results in [place].  You should remain in isolation for at least 10 days.  Return to the emergency department for new or worsening symptoms.   Call your doctor or return to the Emergency Department (ED) if you are unable to tolerate fluids due to vomiting, have worsening trouble breathing, become extremely tired or difficult to awaken, or if you develop any other symptoms that concern you.

## 2019-07-13 NOTE — ED Triage Notes (Signed)
Pt c/o new cough, sinus pressure, sore throat, congested cough, chest pressure x 2 days. Pt denies fever or chills.

## 2019-07-15 LAB — NOVEL CORONAVIRUS, NAA (HOSP ORDER, SEND-OUT TO REF LAB; TAT 18-24 HRS): SARS-CoV-2, NAA: NOT DETECTED

## 2019-08-13 ENCOUNTER — Other Ambulatory Visit: Payer: Self-pay

## 2019-08-13 ENCOUNTER — Encounter (HOSPITAL_BASED_OUTPATIENT_CLINIC_OR_DEPARTMENT_OTHER): Payer: Self-pay

## 2019-08-13 ENCOUNTER — Emergency Department (HOSPITAL_BASED_OUTPATIENT_CLINIC_OR_DEPARTMENT_OTHER)
Admission: EM | Admit: 2019-08-13 | Discharge: 2019-08-13 | Disposition: A | Discharge status: Home / Self Care | Payer: Medicaid Other | Attending: Emergency Medicine | Admitting: Emergency Medicine

## 2019-08-13 DIAGNOSIS — L02419 Cutaneous abscess of limb, unspecified: Secondary | ICD-10-CM

## 2019-08-13 DIAGNOSIS — L02411 Cutaneous abscess of right axilla: Secondary | ICD-10-CM | POA: Insufficient documentation

## 2019-08-13 DIAGNOSIS — F17210 Nicotine dependence, cigarettes, uncomplicated: Secondary | ICD-10-CM | POA: Diagnosis not present

## 2019-08-13 MED ORDER — LIDOCAINE-EPINEPHRINE (PF) 2 %-1:200000 IJ SOLN
10.0000 mL | Freq: Once | INTRAMUSCULAR | Status: AC
  Administered 2019-08-13: 10 mL
  Filled 2019-08-13 (×2): qty 10

## 2019-08-13 NOTE — ED Triage Notes (Signed)
Pt c/o abscess to right axilla x 2 weeks-also c/o "bump" to left hip area x 2-3 days-NAD-steady gait

## 2019-08-13 NOTE — ED Provider Notes (Signed)
[facility] EMERGENCY DEPARTMENT Provider Note   CSN: [id number] Arrival date & time: 08/13/19  1444     History   Chief Complaint Chief Complaint  Patient presents with  . Abscess    HPI Jamie Hamilton is a 33 y.o. female.     Patient with history of abscess presents the emergency department with right axillary abscess ongoing over the past 2 weeks.  Area has become progressively more painful and swollen.  No active drainage.  No fevers, nausea, vomiting.  Patient also noted a smaller bump to the left hip area.  She has had boils in other places in the past.  She has been placing warm compresses on the area with some initial improvement however this is no longer effective.     Past Medical History:  Diagnosis Date  . Pregnant   . Vitamin D deficiency     There are no active problems to display for this patient.   Past Surgical History:  Procedure Laterality Date  . TONSILLECTOMY       OB History    Gravida  2   Para  1   Term      Preterm      AB      Living        SAB      TAB      Ectopic      Multiple      Live Births               Home Medications    Prior to Admission medications   Medication Sig Start Date End Date Taking? Authorizing Provider  Cholecalciferol (VITAMIN D PO) Take by mouth.    [provider]    Family History Family History  Problem Relation Age of Onset  . Cancer Other   . Diabetes Other     Social History Social History   Tobacco Use  . Smoking status: Current Every Day Smoker    Packs/day: 0.50    Types: Cigarettes  . Smokeless tobacco: Never Used  Substance Use Topics  . Alcohol use: Yes    Comment: occ  . Drug use: Yes    Types: Marijuana     Allergies   Patient has no known allergies.   Review of Systems Review of Systems  Constitutional: Negative for fever.  Gastrointestinal: Negative for nausea and vomiting.  Skin: Negative for color change.       Positive  for abscess.  Hematological: Negative for adenopathy.     Physical Exam Updated Vital Signs BP 121/67 (BP Location: Left Arm)   Pulse 74   Temp 99.4 F (37.4 C) (Oral)   Resp 20   Ht 5\' 8"  (1.727 m)   Wt 100.7 kg   LMP 07/31/2019   SpO2 100%   BMI 33.75 kg/m   Physical Exam Vitals signs and nursing note reviewed.  Constitutional:      Appearance: She is well-developed.  HENT:     Head: Normocephalic and atraumatic.  Eyes:     General:        Right eye: No discharge.        Left eye: No discharge.     Conjunctiva/sclera: Conjunctivae normal.  Neck:     Musculoskeletal: Normal range of motion and neck supple.  Cardiovascular:     Rate and Rhythm: Normal rate and regular rhythm.     Heart sounds: Normal heart sounds.  Pulmonary:     Effort:  Pulmonary effort is normal.     Breath sounds: Normal breath sounds.  Abdominal:     Palpations: Abdomen is soft.     Tenderness: There is no abdominal tenderness.  Skin:    General: Skin is warm and dry.     Comments: Patient with 3 x 1 cm area of induration in the right axilla consistent with abscess.  Patient has a small papule noted to the left hip.  No active drainage in any of these areas.  Neurological:     Mental Status: She is alert.      ED Treatments / Results  Labs (all labs ordered are listed, but only abnormal results are displayed) Labs Reviewed - No data to display  EKG None  Radiology No results found.  Procedures .Marland Kitchen.Incision and Drainage  Date/Time: 08/13/2019 4:30 PM Performed by: Renne CriglerGeiple, Disaya Walt, PA-C Authorized by: Renne CriglerGeiple, Tierney Behl, PA-C   Consent:    Consent obtained:  Verbal   Consent given by:  Patient   Risks discussed:  Bleeding, incomplete drainage, pain and infection   Alternatives discussed:  No treatment Location:    Type:  Abscess   Size:  3cm x 1cm   Location:  Upper extremity   Upper extremity location: R axilla. Pre-procedure details:    Skin preparation:  Betadine  Anesthesia (see MAR for exact dosages):    Anesthesia method:  Local infiltration   Local anesthetic:  Lidocaine 2% WITH epi Procedure type:    Complexity:  Simple Procedure details:    Needle aspiration: no     Incision types:  Stab incision   Scalpel blade:  11   Wound management:  Probed and deloculated   Drainage:  Purulent and bloody   Drainage amount:  Moderate   Wound treatment:  Wound left open   Packing materials:  1/4 in iodoform gauze Post-procedure details:    Patient tolerance of procedure:  Tolerated well, no immediate complications   (including critical care time)  Medications Ordered in ED Medications  lidocaine-EPINEPHrine (XYLOCAINE W/EPI) 2 %-1:200000 (PF) injection 10 mL (10 mLs Infiltration Given 08/13/19 1600)     Initial Impression / Assessment and Plan / ED Course  I have reviewed the triage vital signs and the nursing notes.  Pertinent labs & imaging results that were available during my care of the patient were reviewed by me and considered in my medical decision making (see chart for details).        Patient seen and examined.  Discussed I&D procedure with patient at bedside and she agrees to proceed.  Left hip pustule/papule not need any treatment.  This is too small to drain at this point.  Vital signs reviewed and are as follows: BP 121/67 (BP Location: Left Arm)   Pulse 74   Temp 99.4 F (37.4 C) (Oral)   Resp 20   Ht 5\' 8"  (1.727 m)   Wt 100.7 kg   LMP 07/31/2019   SpO2 100%   BMI 33.75 kg/m   The patient was urged to return to the Emergency Department urgently with worsening pain, swelling, expanding erythema especially if it streaks away from the affected area, fever, or if they have any other concerns.   The patient was instructed to remove packing at home in 24-48 hours and return to the Emergency Department or go to their PCP at that time for a recheck if their symptoms are not entirely resolved.  The patient verbalized  understanding and stated agreement with this plan.  Final Clinical Impressions(s) / ED Diagnoses   Final diagnoses:  Axillary abscess   Patient with right axillary abscess, status post uncomplicated I&D here in emergency department.  No systemic symptoms of illness.  Follow-up as above.  ED Discharge Orders    None       Renne Crigler, PA-C 08/13/19 1632    Melene Plan, DO 08/13/19 1911

## 2019-08-13 NOTE — Discharge Instructions (Signed)
Please read and follow all provided instructions.  Your diagnoses today include:  1. Axillary abscess     Tests performed today include:  Vital signs. See below for your results today.   Medications prescribed:   None  Take any prescribed medications only as directed.   Home care instructions:   Follow any educational materials contained in this packet  Follow-up instructions: Return to the Emergency Department in 48 hours for a recheck if your symptoms are not significantly improved.  Remove the packing gently at home in the next 24 to 48 hours and resume warm compresses on the area.  If the packing falls out sooner, it is not a problem.  Just start warm compresses.  Return instructions:  Return to the Emergency Department if you have:  Fever  Worsening symptoms  Worsening pain  Worsening swelling  Redness of the skin that moves away from the affected area, especially if it streaks away from the affected area   Any other emergent concerns   Your vital signs today were: BP 121/67 (BP Location: Left Arm)    Pulse 74    Temp 99.4 F (37.4 C) (Oral)    Resp 20    Ht 5\' 8"  (1.727 m)    Wt 100.7 kg    LMP 07/31/2019    SpO2 100%    BMI 33.75 kg/m  If your blood pressure (BP) was elevated above 135/85 this visit, please have this repeated by your doctor within one month. --------------

## 2020-02-11 ENCOUNTER — Other Ambulatory Visit: Payer: Self-pay

## 2020-02-11 ENCOUNTER — Emergency Department (HOSPITAL_BASED_OUTPATIENT_CLINIC_OR_DEPARTMENT_OTHER)
Admission: EM | Admit: 2020-02-11 | Discharge: 2020-02-12 | Disposition: A | Discharge status: Home / Self Care | Payer: Medicaid Other | Attending: Emergency Medicine | Admitting: Emergency Medicine

## 2020-02-11 ENCOUNTER — Encounter (HOSPITAL_BASED_OUTPATIENT_CLINIC_OR_DEPARTMENT_OTHER): Payer: Self-pay | Admitting: *Deleted

## 2020-02-11 DIAGNOSIS — R079 Chest pain, unspecified: Secondary | ICD-10-CM | POA: Diagnosis not present

## 2020-02-11 DIAGNOSIS — Z5321 Procedure and treatment not carried out due to patient leaving prior to being seen by health care provider: Secondary | ICD-10-CM | POA: Insufficient documentation

## 2020-02-11 NOTE — ED Triage Notes (Signed)
Pt reports mid sternal CP that is non radiating deep breaths and palpation reproduces pain X 3 days . Productive cough, no fever. Seen at [facility] earlier and [facility], labs xray ekg done

## 2020-02-12 ENCOUNTER — Other Ambulatory Visit: Payer: Self-pay

## 2020-02-12 ENCOUNTER — Encounter (HOSPITAL_BASED_OUTPATIENT_CLINIC_OR_DEPARTMENT_OTHER): Payer: Self-pay

## 2020-02-12 ENCOUNTER — Emergency Department (HOSPITAL_BASED_OUTPATIENT_CLINIC_OR_DEPARTMENT_OTHER)
Admission: EM | Admit: 2020-02-12 | Discharge: 2020-02-12 | Disposition: A | Discharge status: Home / Self Care | Payer: Medicaid Other | Source: Home / Self Care | Attending: Emergency Medicine | Admitting: Emergency Medicine

## 2020-02-12 DIAGNOSIS — N63 Unspecified lump in unspecified breast: Secondary | ICD-10-CM

## 2020-02-12 DIAGNOSIS — J069 Acute upper respiratory infection, unspecified: Secondary | ICD-10-CM

## 2020-02-12 MED ORDER — FEXOFENADINE HCL 180 MG PO TABS: 180.0000 mg | ORAL_TABLET | Freq: Every day | ORAL | 0 refills | Status: AC

## 2020-02-12 MED ORDER — DOXYCYCLINE HYCLATE 100 MG PO CAPS: 100.0000 mg | ORAL_CAPSULE | Freq: Two times a day (BID) | ORAL | 0 refills | Status: AC

## 2020-02-12 MED ORDER — FLUTICASONE PROPIONATE 50 MCG/ACT NA SUSP: 2.0000 | Freq: Every day | NASAL | 0 refills | Status: AC

## 2020-02-12 NOTE — Discharge Instructions (Addendum)
It is very important that you follow with your primary care or OB/GYN for a full breast exam in 2 weeks even if the lesion completely resolves.

## 2020-02-12 NOTE — ED Provider Notes (Signed)
[facility] EMERGENCY DEPARTMENT Provider Note   CSN: [id number] Arrival date & time: 02/12/20  2026     History Chief Complaint  Patient presents with  . Cough    HPI   Blood pressure 136/80, pulse 70, temperature 98.3 F (36.8 C), temperature source Oral, resp. rate 16, height 5\' 8"  (1.727 m), weight 101.4 kg, last menstrual period 01/24/2020, SpO2 100 %, unknown if currently breastfeeding.  Jamie Hamilton is a 34 y.o. female complaining of rhinorrhea, cough worse at night and in the morning hoarse voice, sinus pressure.  She also has a lesion on the left breast which she noted about 2 weeks ago not really painful.  No history of breast cancer, easy bruising or bleeding, unintentional weight loss.  She denies any fevers, chills, sick contacts.  She is not vaccinated for COVID-19.    Past Medical History:  Diagnosis Date  . Vitamin D deficiency     There are no problems to display for this patient.   Past Surgical History:  Procedure Laterality Date  . TONSILLECTOMY       OB History    Gravida  2   Para  1   Term      Preterm      AB      Living        SAB      TAB      Ectopic      Multiple      Live Births              Family History  Problem Relation Age of Onset  . Cancer Other   . Diabetes Other     Social History   Tobacco Use  . Smoking status: Current Every Day Smoker    Packs/day: 0.50    Types: Cigarettes  . Smokeless tobacco: Never Used  Substance Use Topics  . Alcohol use: Yes    Comment: occ  . Drug use: Yes    Types: Marijuana    Home Medications Prior to Admission medications   Medication Sig Start Date End Date Taking? Authorizing Provider  Cholecalciferol (VITAMIN D PO) Take by mouth.    [provider]  doxycycline (VIBRAMYCIN) 100 MG capsule Take 1 capsule (100 mg total) by mouth 2 (two) times daily. 02/12/20   Tamari Busic, Elmyra Ricks, PA-C  fexofenadine (ALLEGRA ALLERGY) 180 MG tablet Take 1  tablet (180 mg total) by mouth daily for 7 days. 02/12/20 02/19/20  Leiana Rund, Elmyra Ricks, PA-C  fluticasone (FLONASE) 50 MCG/ACT nasal spray Place 2 sprays into both nostrils daily for 7 days. 02/12/20 02/19/20  Klinton Candelas, Elmyra Ricks, PA-C    Allergies    Patient has no known allergies.  Review of Systems   Review of Systems   A complete review of systems was obtained and all systems are negative except as noted in the HPI and PMH.    Physical Exam Updated Vital Signs BP 136/80 (BP Location: Left Arm)   Pulse 70   Temp 98.3 F (36.8 C) (Oral)   Resp 16   Ht 5\' 8"  (1.727 m)   Wt 101.4 kg   LMP 01/24/2020   SpO2 100%   BMI 34.00 kg/m   Physical Exam Vitals and nursing note reviewed.  Constitutional:      Appearance: She is well-developed.  HENT:     Head: Normocephalic.     Right Ear: External ear normal.     Left Ear: External ear normal.  Mouth/Throat:     Pharynx: No oropharyngeal exudate.  Eyes:     Conjunctiva/sclera: Conjunctivae normal.     Pupils: Pupils are equal, round, and reactive to light.  Cardiovascular:     Rate and Rhythm: Normal rate and regular rhythm.  Pulmonary:     Effort: Pulmonary effort is normal. No respiratory distress.     Breath sounds: Normal breath sounds. No stridor. No wheezing or rales.  Chest:     Chest wall: No tenderness.    Abdominal:     Palpations: Abdomen is soft.     Tenderness: There is no abdominal tenderness. There is no guarding or rebound.  Musculoskeletal:     Cervical back: Normal range of motion and neck supple.     ED Results / Procedures / Treatments   Labs (all labs ordered are listed, but only abnormal results are displayed) Labs Reviewed - No data to display  EKG None  Radiology No results found.  Procedures Procedures (including critical care time)  Medications Ordered in ED Medications - No data to display  ED Course  I have reviewed the triage vital signs and the nursing notes.  Pertinent labs  & imaging results that were available during my care of the patient were reviewed by me and considered in my medical decision making (see chart for details).    MDM Rules/Calculators/A&P                      Vitals:   02/12/20 2041 02/12/20 2042  BP: 136/80   Pulse: 70   Resp: 16   Temp: 98.3 F (36.8 C)   TempSrc: Oral   SpO2: 100%   Weight:  101.4 kg  Height:  5\' 8"  (1.727 m)     Jamie Hamilton is 34 y.o. female presenting with what appears to be a viral URI with postnasal drip, lung sounds are clear to auscultation, she saturating well on room air.  No tachypnea or tachycardia.  Doubt pneumonia.  I did an out of an abundance of caution will test for COVID-19.  She does have a left breast mass.  Will treat for abscess but have advised her that even if this completely clear she needs to go for a full breast exam.  Patient verbalized understanding and teach back technique.  Evaluation does not show pathology that would require ongoing emergent intervention or inpatient treatment. Pt is hemodynamically stable and mentating appropriately. Discussed findings and plan with patient/guardian, who agrees with care plan. All questions answered. Return precautions discussed and outpatient follow up given.     Final Clinical Impression(s) / ED Diagnoses Final diagnoses:  Viral URI with cough  Breast mass in female    Rx / DC Orders ED Discharge Orders         Ordered    doxycycline (VIBRAMYCIN) 100 MG capsule  2 times daily     02/12/20 2205    fexofenadine (ALLEGRA ALLERGY) 180 MG tablet  Daily     02/12/20 2205    fluticasone (FLONASE) 50 MCG/ACT nasal spray  Daily     02/12/20 2205           Abdulraheem Pineo, 2206 02/12/20 2241    02/14/20, DO 02/12/20 2249

## 2020-02-12 NOTE — ED Triage Notes (Signed)
C/o coughing up blood and URI sx x 2-3 days-NAD-steady gait

## 2020-02-26 ENCOUNTER — Other Ambulatory Visit: Payer: Self-pay

## 2020-02-26 ENCOUNTER — Encounter (HOSPITAL_BASED_OUTPATIENT_CLINIC_OR_DEPARTMENT_OTHER): Payer: Self-pay

## 2020-02-26 ENCOUNTER — Emergency Department (HOSPITAL_BASED_OUTPATIENT_CLINIC_OR_DEPARTMENT_OTHER)
Admission: EM | Admit: 2020-02-26 | Discharge: 2020-02-26 | Disposition: A | Discharge status: Home / Self Care | Payer: Medicaid Other | Attending: Emergency Medicine | Admitting: Emergency Medicine

## 2020-02-26 DIAGNOSIS — H5711 Ocular pain, right eye: Secondary | ICD-10-CM | POA: Diagnosis present

## 2020-02-26 DIAGNOSIS — F17210 Nicotine dependence, cigarettes, uncomplicated: Secondary | ICD-10-CM | POA: Diagnosis not present

## 2020-02-26 DIAGNOSIS — H5789 Other specified disorders of eye and adnexa: Secondary | ICD-10-CM | POA: Diagnosis not present

## 2020-02-26 DIAGNOSIS — Z79899 Other long term (current) drug therapy: Secondary | ICD-10-CM | POA: Insufficient documentation

## 2020-02-26 DIAGNOSIS — H538 Other visual disturbances: Secondary | ICD-10-CM | POA: Diagnosis not present

## 2020-02-26 MED ORDER — ERYTHROMYCIN 5 MG/GM OP OINT: TOPICAL_OINTMENT | OPHTHALMIC | 0 refills | Status: AC

## 2020-02-26 MED ORDER — TETRACAINE HCL 0.5 % OP SOLN
OPHTHALMIC | Status: AC
  Administered 2020-02-26: 2 [drp]
  Filled 2020-02-26: qty 4

## 2020-02-26 MED ORDER — FLUORESCEIN SODIUM 1 MG OP STRP
ORAL_STRIP | OPHTHALMIC | Status: AC
  Administered 2020-02-26: 17:00:00 1
  Filled 2020-02-26: qty 1

## 2020-02-26 NOTE — ED Triage Notes (Signed)
Pt states she woke this am with swelling to right eyelid and "feeling like a grain of sane in there"-denies injury or possible fb-NAD-steady gait

## 2020-02-26 NOTE — ED Provider Notes (Signed)
[facility] Provider Note   CSN: [id number] Arrival date & time: 02/26/20  1615     History Chief Complaint  Patient presents with  . Eye Problem    Jamie Hamilton is a 34 y.o. female.  34 y.o female with a PMH of vitamin D deficiency presents to the ED with a chief complaint of right eye pain x today.  She reports waking up with a swollen right eyelid, states there is blurry vision associated with this.  She reports she has not had any trauma, congestion, other symptoms.  She has not taken any medication for improvement in her symptoms.  She does not have any pain with eye movement, does have discomfort along with mild discharge from the eye.  No headache, injury, or fever.  The history is provided by the patient and medical records.  Eye Problem Associated symptoms: discharge and itching   Associated symptoms: no photophobia and no redness        Past Medical History:  Diagnosis Date  . Vitamin D deficiency     There are no problems to display for this patient.   Past Surgical History:  Procedure Laterality Date  . TONSILLECTOMY       OB History    Gravida  2   Para  1   Term      Preterm      AB      Living        SAB      TAB      Ectopic      Multiple      Live Births              Family History  Problem Relation Age of Onset  . Cancer Other   . Diabetes Other     Social History   Tobacco Use  . Smoking status: Current Every Day Smoker    Packs/day: 0.50    Types: Cigarettes  . Smokeless tobacco: Never Used  Substance Use Topics  . Alcohol use: Yes    Comment: occ  . Drug use: Yes    Types: Marijuana    Home Medications Prior to Admission medications   Medication Sig Start Date End Date Taking? Authorizing Provider  Cholecalciferol (VITAMIN D PO) Take by mouth.    [provider]  doxycycline (VIBRAMYCIN) 100 MG capsule Take 1 capsule (100 mg total) by mouth 2 (two) times daily.  02/12/20   Pisciotta, Elmyra Ricks, PA-C  erythromycin ophthalmic ointment Place a 1/2 inch ribbon of ointment into the lower eyelid. 02/26/20   Janeece Fitting, PA-C  fexofenadine (ALLEGRA ALLERGY) 180 MG tablet Take 1 tablet (180 mg total) by mouth daily for 7 days. 02/12/20 02/19/20  Pisciotta, Elmyra Ricks, PA-C  fluticasone (FLONASE) 50 MCG/ACT nasal spray Place 2 sprays into both nostrils daily for 7 days. 02/12/20 02/19/20  Pisciotta, Elmyra Ricks, PA-C    Allergies    Patient has no known allergies.  Review of Systems   Review of Systems  Constitutional: Negative for fever.  Eyes: Positive for pain, discharge and itching. Negative for photophobia and redness.    Physical Exam Updated Vital Signs BP 126/68 (BP Location: Left Arm)   Pulse 79   Temp 98.2 F (36.8 C) (Oral)   Resp 18   Ht 5\' 8"  (1.727 m)   Wt 103.4 kg   LMP 02/20/2020   SpO2 100%   BMI 34.67 kg/m   Physical Exam Vitals and nursing note reviewed.  Constitutional:      Appearance: Normal appearance.  HENT:     Head: Normocephalic and atraumatic.     Nose: Nose normal.     Mouth/Throat:     Mouth: Mucous membranes are moist.  Eyes:     General: Lids are everted, no foreign bodies appreciated. No scleral icterus.       Right eye: Discharge present. No hordeolum.        Left eye: No discharge.     Extraocular Movements: Extraocular movements intact.     Right eye: Normal extraocular motion and no nystagmus.     Left eye: Normal extraocular motion and no nystagmus.     Conjunctiva/sclera:     Right eye: Right conjunctiva is injected. No chemosis, exudate or hemorrhage.    Left eye: Left conjunctiva is not injected. No chemosis, exudate or hemorrhage.    Pupils: Pupils are equal, round, and reactive to light.     Comments: No fluorescein uptake, full EOM without pain. Yellow crusting discharge noted to the  Eye.   Cardiovascular:     Rate and Rhythm: Normal rate.  Pulmonary:     Effort: Pulmonary effort is normal.  Abdominal:      General: Abdomen is flat.  Musculoskeletal:     Cervical back: Normal range of motion and neck supple.  Skin:    General: Skin is warm and dry.  Neurological:     Mental Status: She is alert and oriented to person, place, and time.      Visual Acuity  Right Eye Distance: 20/100 Left Eye Distance: 20/25 Bilateral Distance: 20/30  Right Eye Near:   Left Eye Near:    Bilateral Near:     ED Results / Procedures / Treatments   Labs (all labs ordered are listed, but only abnormal results are displayed) Labs Reviewed - No data to display  EKG None  Radiology No results found.  Procedures Procedures (including critical care time)  Medications Ordered in ED Medications  fluorescein 1 MG ophthalmic strip (1 strip  Given 02/26/20 1651)  tetracaine (PONTOCAINE) 0.5 % ophthalmic solution (2 drops  Given 02/26/20 1651)    ED Course  I have reviewed the triage vital signs and the nursing notes.  Pertinent labs & imaging results that were available during my care of the patient were reviewed by me and considered in my medical decision making (see chart for details).    MDM Rules/Calculators/A&P   Patient with 1 day of right eye swelling after waking up this morning presents to the ED with complaints of visual disturbance.  States this occurred this morning, does have some itching along with irritation to the eye, has full extraocular movements without pain, no signs of entrapment.  Visual acuity performed prior to staining of the eye, fluorescein uptake not present, does have significant amount of crusting along the eye, swelling of the eyelid, consistent with blepharitis however no crusting noted around eyelashes.  Differential diagnoses included but not limited to orbital cellulitis, preseptal cellulitis, blepharitis.  During evaluation there is pain with palpation along the external aspect of the eye, full EOMs noted, pupils are reactive and equal, no injection noted to the  right eye, no fluorescein uptake.  Visual acuity is decreased on the right eye.  No pain with EOMs, lower suspicion for orbital cellulitis.  Patient does report she does suffer from allergies, has been taking Benadryl without improvement.  We discussed erythromycin with Zyrtec along with ointment for likely seasonal allergies.  She will also go home with this prescription.  She was given discharge to follow-up with a primary care physician as needed.  Patient is an agrees with management, return precautions discussed at length.  Discussed case with my attending Dr. Fredderick Severance who has also evaluated patient and agrees with plan and management.   Portions of this note were generated with Scientist, clinical (histocompatibility and immunogenetics). Dictation errors may occur despite best attempts at proofreading.  Final Clinical Impression(s) / ED Diagnoses Final diagnoses:  Eye irritation    Rx / DC Orders ED Discharge Orders         Ordered    erythromycin ophthalmic ointment     02/26/20 1723           Claude Manges, PA-C 02/26/20 1726    Vanetta Mulders, MD 02/29/20 1114

## 2020-02-26 NOTE — Discharge Instructions (Addendum)
You may take Zyrtec, over the counter to help with  your symptoms.   I have also prescribed erythromycin ointment, please place this to your right lower eyelid daily for the next 5 days.   Follow up with your primary care physician as needed.

## 2020-02-26 NOTE — ED Provider Notes (Signed)
Medical screening examination/treatment/procedure(s) were conducted as a shared visit with non-physician practitioner(s) and myself.  I personally evaluated the patient during the encounter.      Patient seen by me along with the physician assistant.  Patient awoke this morning with some right eye itching little bit of burning.  Was doing some rubbing of the eye throughout the day and then was noticed that upper and lower lids got swollen.  Patient does not wear contacts.  Patient does have a history of allergies but not severely per se to tree pollen which is more prevalent currently.  Patient states she is also had some congestion.  Left eye without any particular problems.  Physician assistant floor seen the eye without evidence of any corneal abrasion.  Patient's symptoms seem to be consistent with an allergic conjunctivitis.  Anterior chamber appears normal on my exam.  Cornea grossly normal.  No evidence of any stye or lid infection.  Patient did take some Benadryl earlier today because she was thinking it was allergic did not seem to help that much.  Will probably treat her with a longer acting's antihistamine like Allegra or Zyrtec.  And also give her some erythromycin ophthalmic ointment to put in the eye just to prevent any secondary infection.  Extraocular muscles are intact no pain with movement of the eye at all.  No concerns for orbital cellulitis.   Vanetta Mulders, MD 02/26/20 1728

## 2021-04-03 IMAGING — DX DG CHEST 1V PORT
1 series · 1 of 1 positions shown · non-contrast
Comparison: 12/06/2016

CLINICAL DATA: Cough with chest pressure and sore throat.

EXAM:
PORTABLE CHEST 1 VIEW

[chest ap]
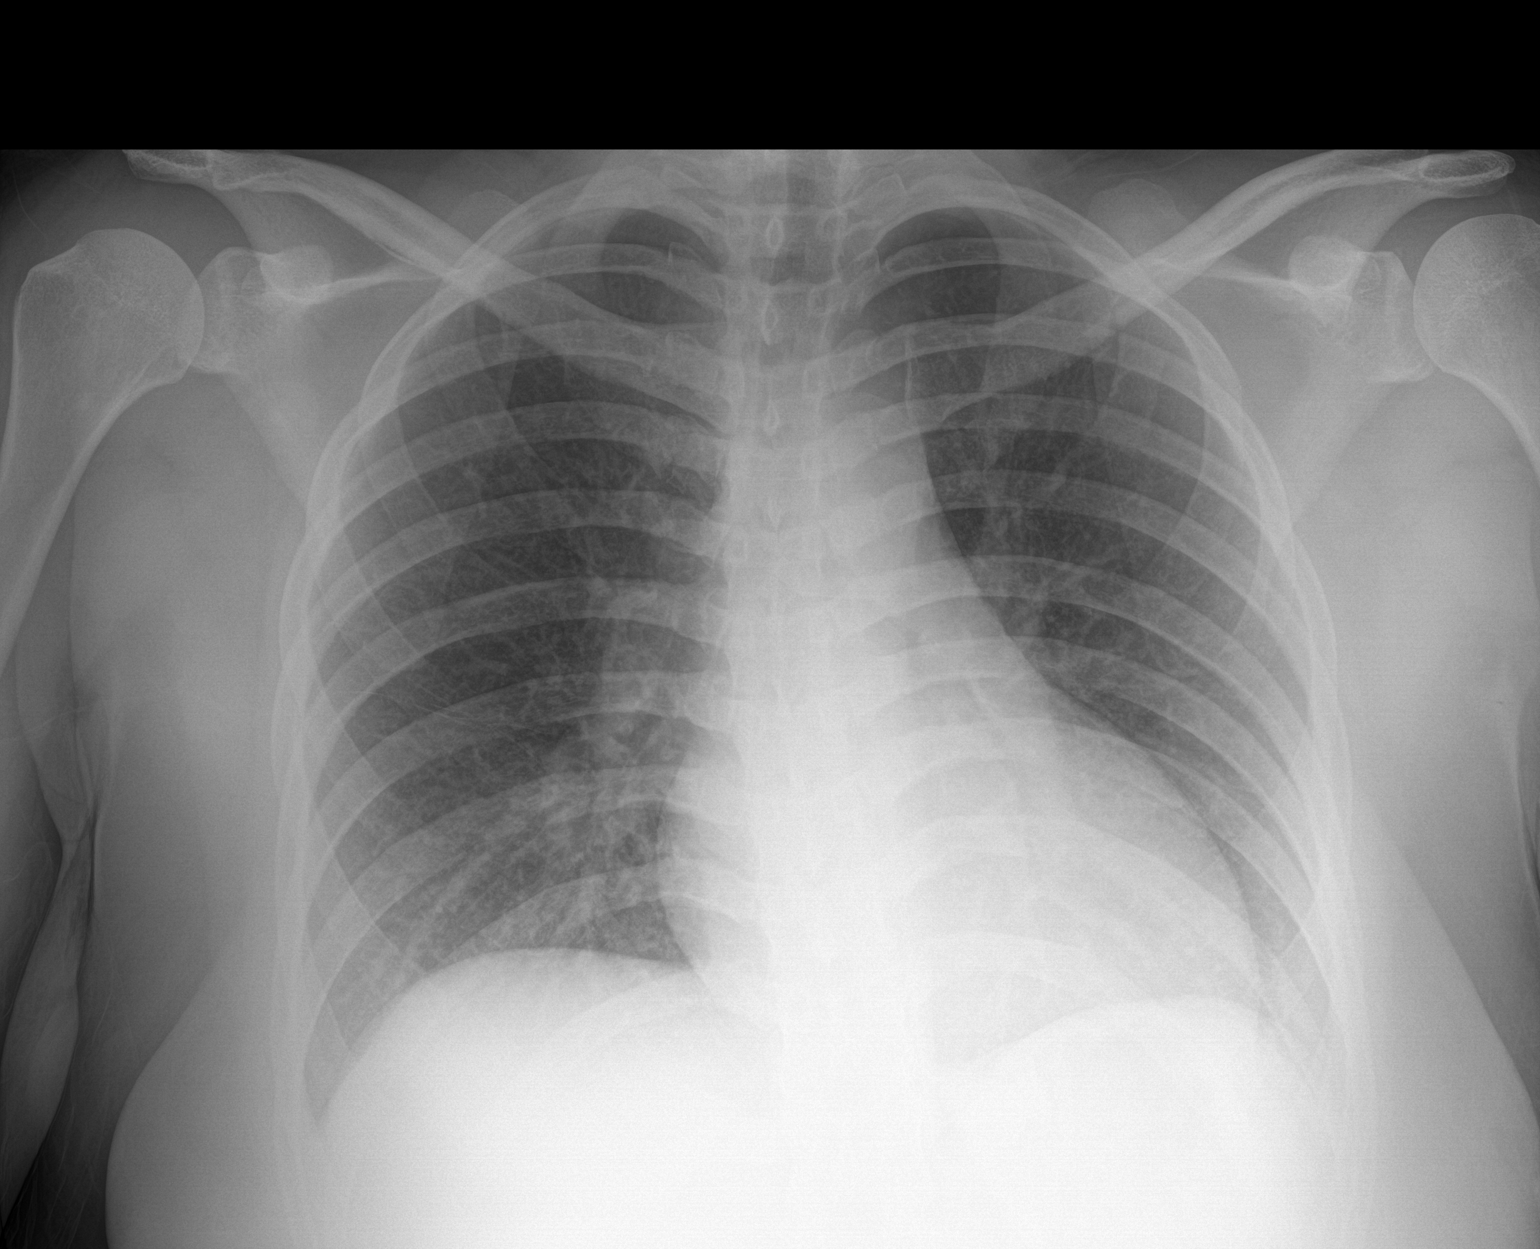

[1 of 1 positions shown; findings below may reference images not displayed]

FINDINGS: 5475 hours. The lungs are clear without focal pneumonia, edema,
pneumothorax or pleural effusion. The cardiopericardial silhouette
is within normal limits for size. The visualized bony structures of
the thorax are intact.
IMPRESSION: Stable.  No acute cardiopulmonary findings.

## 2024-03-31 ENCOUNTER — Other Ambulatory Visit: Payer: Self-pay

## 2024-03-31 ENCOUNTER — Encounter (HOSPITAL_BASED_OUTPATIENT_CLINIC_OR_DEPARTMENT_OTHER): Payer: Self-pay | Admitting: Emergency Medicine

## 2024-03-31 ENCOUNTER — Emergency Department (HOSPITAL_BASED_OUTPATIENT_CLINIC_OR_DEPARTMENT_OTHER)

## 2024-03-31 ENCOUNTER — Emergency Department (HOSPITAL_BASED_OUTPATIENT_CLINIC_OR_DEPARTMENT_OTHER)
Admission: EM | Admit: 2024-03-31 | Discharge: 2024-03-31 | Disposition: A | Discharge status: Home / Self Care | Attending: Emergency Medicine | Admitting: Emergency Medicine

## 2024-03-31 DIAGNOSIS — R09A2 Foreign body sensation, throat: Secondary | ICD-10-CM | POA: Insufficient documentation

## 2024-03-31 DIAGNOSIS — R0602 Shortness of breath: Secondary | ICD-10-CM | POA: Diagnosis present

## 2024-03-31 MED ORDER — PANTOPRAZOLE SODIUM 40 MG PO TBEC: 40.0000 mg | DELAYED_RELEASE_TABLET | Freq: Every day | ORAL | 0 refills | Status: AC

## 2024-03-31 MED ORDER — DEXAMETHASONE 4 MG PO TABS
6.0000 mg | ORAL_TABLET | Freq: Once | ORAL | Status: AC
  Administered 2024-03-31: 6 mg via ORAL
  Filled 2024-03-31: qty 2

## 2024-03-31 MED ORDER — PANTOPRAZOLE SODIUM 40 MG PO TBEC
40.0000 mg | DELAYED_RELEASE_TABLET | Freq: Once | ORAL | Status: AC
  Administered 2024-03-31: 40 mg via ORAL
  Filled 2024-03-31: qty 1

## 2024-03-31 NOTE — ED Provider Notes (Signed)
 [place] [facility] DEPARTMENT AT [facility] Provider Note   CSN: [id number] Arrival date & time: 03/31/24  0110     History Chief Complaint  Patient presents with   Shortness of Breath    HPI Jamie Hamilton is a 38 y.o. female presenting for chief complaint of globus. Felt like she has been having this sensation in the back of her throat. Denies fevers chills nausa vomiting  Patient's recorded medical, surgical, social, medication list and allergies were reviewed in the Snapshot window as part of the initial history.   Review of Systems   Review of Systems  Constitutional:  Negative for chills and fever.  HENT:  Negative for ear pain and sore throat.   Eyes:  Negative for pain and visual disturbance.  Respiratory:  Negative for cough and shortness of breath.   Cardiovascular:  Negative for chest pain and palpitations.  Gastrointestinal:  Negative for abdominal pain and vomiting.  Genitourinary:  Negative for dysuria and hematuria.  Musculoskeletal:  Negative for arthralgias and back pain.  Skin:  Negative for color change and rash.  Neurological:  Negative for seizures and syncope.  All other systems reviewed and are negative.   Physical Exam Updated Vital Signs BP 138/63 (BP Location: Right Arm)   Pulse 89   Temp 98.4 F (36.9 C) (Oral)   Resp (!) 22   Ht 5\' 8"  (1.727 m)   Wt 103.4 kg   LMP 02/20/2020   SpO2 100%   BMI 34.67 kg/m  Physical Exam Vitals and nursing note reviewed.  Constitutional:      General: She is not in acute distress.    Appearance: She is well-developed.  HENT:     Head: Normocephalic and atraumatic.  Eyes:     Conjunctiva/sclera: Conjunctivae normal.  Cardiovascular:     Rate and Rhythm: Normal rate and regular rhythm.     Heart sounds: No murmur heard. Pulmonary:     Effort: Pulmonary effort is normal. No respiratory distress.     Breath sounds: Normal breath sounds.  Abdominal:     General: There is no distension.      Palpations: Abdomen is soft.     Tenderness: There is no abdominal tenderness. There is no right CVA tenderness or left CVA tenderness.  Musculoskeletal:        General: No swelling or tenderness. Normal range of motion.     Cervical back: Neck supple.  Skin:    General: Skin is warm and dry.  Neurological:     General: No focal deficit present.     Mental Status: She is alert and oriented to person, place, and time. Mental status is at baseline.     Cranial Nerves: No cranial nerve deficit.      ED Course/ Medical Decision Making/ A&P    Procedures Procedures   Medications Ordered in ED Medications  pantoprazole (PROTONIX) EC tablet 40 mg (40 mg Oral Given 03/31/24 0232)  dexamethasone (DECADRON) tablet 6 mg (6 mg Oral Given 03/31/24 0232)    Medical Decision Making:   Jamie Hamilton is a 38 y.o. female who presented to the ED today with chief complaint of globus detailed above.    Patient placed on continuous vitals and telemetry monitoring while in ED which was reviewed periodically.  Complete initial physical exam performed, notably the patient  was HDS in NAD.    Reviewed and confirmed nursing documentation for past medical history, family history, social history.  Initial Assessment:   With the patient's presentation of globus, most likely diagnosis is nonspecific soft tissue swelling. Other diagnoses were considered including (but not limited to) foreign body, vocal cord irritation, inverse vocal cord dysfunction.. These are considered less likely due to history of present illness and physical exam findings.   This is most consistent with an acute complicated illness  Initial Plan:  Lateral neck XR to eval for foreign body  Objective evaluation as below reviewed   Initial Study Results:    Radiology:  All images reviewed independently. Agree with radiology report at this time.   DG Neck Soft Tissue Result Date: 03/31/2024 EXAM: 1 VIEW XRAY OF THE SOFT TISSUE  NECK 03/31/2024 02:28:00 AM COMPARISON: None available. CLINICAL HISTORY: Globus/Foreign body. Throat discomfort x 2 days. Took a pill pta and felt like it got stuck. SHOB. FINDINGS: SOFT TISSUES: Unremarkable. No retropharyngeal soft tissue swelling or gas. EPIGLOTTIS: No epiglottic thickening. BONES: No acute osseous abnormality. No radiopaque foreign body is seen. IMPRESSION: 1. No radiopaque foreign body. Electronically signed by: Zadie Herter MD 03/31/2024 02:48 AM EDT RP Workstation: [id number]     Reassessment and Plan:   No visibible foreign body on XR. Will treat for soft tissue swelling/pharyngitis with decadron and more likely etiology of GERD/Laryngospasm with prontonix 40.     Disposition:  I have considered need for hospitalization, however, considering all of the above, I believe this patient is stable for discharge at this time.  Patient/family educated about specific return precautions for given chief complaint and symptoms.  Patient/family educated about follow-up with PCP   Patient/family expressed understanding of return precautions and need for follow-up. Patient spoken to regarding all imaging and laboratory results and appropriate follow up for these results. All education provided in verbal form with additional information in written form. Time was allowed for answering of patient questions. Patient discharged.    Emergency Department Medication Summary:     Clinical Impression:  1. Globus sensation      Discharge   Final Clinical Impression(s) / ED Diagnoses Final diagnoses:  Globus sensation    Rx / DC Orders ED Discharge Orders          Ordered    pantoprazole (PROTONIX) 40 MG tablet  Daily        03/31/24 0148              Onetha Bile, MD 03/31/24 [phone]

## 2024-03-31 NOTE — ED Triage Notes (Signed)
 Pt reports feeling SOB and feeling like something is stuck in her throat after taking an acid reflux pill about 10 mins ago, pt denies any breathing and lung hx, RT at bedside, O2 sat 100% RA
